# Patient Record
Sex: Male | Born: 1990 | Race: Black or African American | Hispanic: No | Marital: Single | Smoking: Never smoker
Health system: Southern US, Community
[De-identification: ages and names within clinical notes are randomized; demographics above are authoritative.]

---
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
## 2014-07-10 ENCOUNTER — Telehealth: Payer: Self-pay | Admitting: Family Medicine

## 2014-07-10 NOTE — Telephone Encounter (Signed)
Father called and would like a copy of his son's shot records left up front for pick up. His is no longer a patient here. Please call him at [phone] dad's name is also Jermond. jw

## 2014-07-10 NOTE — Telephone Encounter (Signed)
Father aware that record is ready for pick up. Jazmin Hartsell,CMA

## 2018-02-21 ENCOUNTER — Ambulatory Visit (HOSPITAL_COMMUNITY)
Admission: EM | Admit: 2018-02-21 | Discharge: 2018-02-21 | Disposition: A | Discharge status: Home / Self Care | Payer: BLUE CROSS/BLUE SHIELD | Attending: Physician Assistant | Admitting: Physician Assistant

## 2018-02-21 ENCOUNTER — Encounter (HOSPITAL_COMMUNITY): Payer: Self-pay | Admitting: Emergency Medicine

## 2018-02-21 DIAGNOSIS — S61312A Laceration without foreign body of right middle finger with damage to nail, initial encounter: Secondary | ICD-10-CM

## 2018-02-21 DIAGNOSIS — W268XXA Contact with other sharp object(s), not elsewhere classified, initial encounter: Secondary | ICD-10-CM

## 2018-02-21 DIAGNOSIS — Z23 Encounter for immunization: Secondary | ICD-10-CM

## 2018-02-21 DIAGNOSIS — S61314A Laceration without foreign body of right ring finger with damage to nail, initial encounter: Secondary | ICD-10-CM | POA: Diagnosis not present

## 2018-02-21 MED ORDER — TETANUS-DIPHTH-ACELL PERTUSSIS 5-2.5-18.5 LF-MCG/0.5 IM SUSP
0.5000 mL | Freq: Once | INTRAMUSCULAR | Status: AC
  Administered 2018-02-21: 0.5 mL via INTRAMUSCULAR

## 2018-02-21 MED ORDER — MUPIROCIN 2 % EX OINT: 1.0000 "application " | TOPICAL_OINTMENT | Freq: Two times a day (BID) | CUTANEOUS | 0 refills | Status: AC

## 2018-02-21 MED ORDER — IBUPROFEN 600 MG PO TABS: 600.0000 mg | ORAL_TABLET | Freq: Four times a day (QID) | ORAL | 0 refills | Status: AC | PRN

## 2018-02-21 MED ORDER — TETANUS-DIPHTH-ACELL PERTUSSIS 5-2.5-18.5 LF-MCG/0.5 IM SUSP
INTRAMUSCULAR | Status: AC
  Filled 2018-02-21: qty 0.5

## 2018-02-21 NOTE — ED Provider Notes (Signed)
02/21/2018 4:02 PM   DOB: 11-09-1991 / MRN: [id number]  SUBJECTIVE:  Steven Zamora is a 27 y.o. male presenting for cuts to the distal right third and fourth digit.  Patient tells me "I got hand caught in a fan in the leaf blower."  Started today.  Patient is worried about infection  He has No Known Allergies.   He  has no past medical history on file.    He  reports that he has never smoked. He does not have any smokeless tobacco history on file. He  has no sexual activity history on file. The patient  has no past surgical history on file.  His family history is not on file.  Review of Systems  Neurological: Negative for focal weakness.    OBJECTIVE:  BP (!) 135/111 (BP Location: Left Arm)   Pulse 76   Temp 98.5 F (36.9 C) (Oral)   Resp 16   SpO2 96%   Physical Exam  Constitutional: He appears well-developed. He is active and cooperative.  Non-toxic appearance.  Cardiovascular: Normal rate.  Pulmonary/Chest: Effort normal. No tachypnea.  Musculoskeletal:       Hands: Neurological: He is alert.  Skin: Skin is warm and dry. He is not diaphoretic. No pallor.  Vitals reviewed.   No results found for this or any previous visit (from the past 72 hour(s)).  No results found.  ASSESSMENT AND PLAN:  Laceration of right middle finger without foreign body with damage to nail, initial encounter: His wounds are superficial and well approximated..  I do not think he will benefit from repair.  Advised that it is best to let this heal by secondary intention.  I have given him medication for pain control along with mupirocin.  Laceration of right ring finger with damage to nail, foreign body presence unspecified, initial encounter      The patient is advised to call or return to clinic if he does not see an improvement in symptoms, or to seek the care of the closest emergency department if he worsens with the above plan.   Deliah BostonMichael Gokul Waybright, MHS, PA-C 02/21/2018 4:02 PM    Ofilia Neaslark,  Elnoria Livingston L, PA-C 02/21/18 [id number]

## 2018-02-21 NOTE — Discharge Instructions (Addendum)
Apply the ointment twice a day.  Wash the wound gently with soap and water.  I prescribed 600 mg ibuprofen that she can take every 6-8 hours for pain.  Please apply the ointment twice a day.

## 2018-02-21 NOTE — ED Triage Notes (Signed)
Pt here for finger laceration to tips from fan
